# Patient Record
Sex: Female | Born: 1955
Health system: Southern US, Community
[De-identification: ages and names within clinical notes are randomized; demographics above are authoritative.]

---

## 2017-10-26 DIAGNOSIS — F410 Panic disorder [episodic paroxysmal anxiety] without agoraphobia: Secondary | ICD-10-CM | POA: Diagnosis not present

## 2018-04-19 DIAGNOSIS — F410 Panic disorder [episodic paroxysmal anxiety] without agoraphobia: Secondary | ICD-10-CM | POA: Diagnosis not present

## 2018-04-19 DIAGNOSIS — F4310 Post-traumatic stress disorder, unspecified: Secondary | ICD-10-CM | POA: Diagnosis not present

## 2018-09-28 DIAGNOSIS — Z20828 Contact with and (suspected) exposure to other viral communicable diseases: Secondary | ICD-10-CM | POA: Diagnosis not present

## 2018-10-18 DIAGNOSIS — F4010 Social phobia, unspecified: Secondary | ICD-10-CM | POA: Diagnosis not present

## 2018-10-18 DIAGNOSIS — F4310 Post-traumatic stress disorder, unspecified: Secondary | ICD-10-CM | POA: Diagnosis not present

## 2019-02-14 DIAGNOSIS — D1801 Hemangioma of skin and subcutaneous tissue: Secondary | ICD-10-CM | POA: Diagnosis not present

## 2019-02-14 DIAGNOSIS — D2339 Other benign neoplasm of skin of other parts of face: Secondary | ICD-10-CM | POA: Diagnosis not present

## 2019-02-14 DIAGNOSIS — Z85828 Personal history of other malignant neoplasm of skin: Secondary | ICD-10-CM | POA: Diagnosis not present

## 2019-02-14 DIAGNOSIS — L820 Inflamed seborrheic keratosis: Secondary | ICD-10-CM | POA: Diagnosis not present

## 2019-02-14 DIAGNOSIS — D485 Neoplasm of uncertain behavior of skin: Secondary | ICD-10-CM | POA: Diagnosis not present

## 2019-02-14 DIAGNOSIS — L570 Actinic keratosis: Secondary | ICD-10-CM | POA: Diagnosis not present

## 2019-02-14 DIAGNOSIS — D225 Melanocytic nevi of trunk: Secondary | ICD-10-CM | POA: Diagnosis not present

## 2019-02-14 DIAGNOSIS — C44719 Basal cell carcinoma of skin of left lower limb, including hip: Secondary | ICD-10-CM | POA: Diagnosis not present

## 2019-03-05 ENCOUNTER — Encounter (HOSPITAL_COMMUNITY): Payer: Self-pay | Admitting: Emergency Medicine

## 2019-03-05 ENCOUNTER — Other Ambulatory Visit: Payer: Self-pay

## 2019-03-05 ENCOUNTER — Ambulatory Visit (HOSPITAL_COMMUNITY)
Admission: EM | Admit: 2019-03-05 | Discharge: 2019-03-05 | Disposition: A | Discharge status: Home / Self Care | Payer: BLUE CROSS/BLUE SHIELD | Attending: Family Medicine | Admitting: Family Medicine

## 2019-03-05 DIAGNOSIS — F418 Other specified anxiety disorders: Secondary | ICD-10-CM | POA: Diagnosis not present

## 2019-03-05 DIAGNOSIS — I10 Essential (primary) hypertension: Secondary | ICD-10-CM | POA: Diagnosis not present

## 2019-03-05 MED ORDER — DILTIAZEM HCL ER 60 MG PO CP12: 60.0000 mg | ORAL_CAPSULE | Freq: Two times a day (BID) | ORAL | 0 refills | Status: AC

## 2019-03-05 NOTE — Discharge Instructions (Signed)
Consider [facility] for a internal medicine or family medicine doctor Take the diltiazem 2 x a day Call or return for problems

## 2019-03-05 NOTE — ED Provider Notes (Signed)
[facility]    CSN: [id number] Arrival date & time: 03/05/19  1344      History   Chief Complaint Chief Complaint  Patient presents with  . Hypertension    HPI Anna Joseph is a 64 y.o. female.   HPI  Patient states that last time she went to her doctor her blood pressure was "borderline".  She has been feeling like she might have high blood pressure for some time.  She states that she has been feeling more tired.  She feels irritable.  She has no headaches.  She even wakes up in the middle the night with headaches sometimes.  She states that this morning she had a nosebleed, for no reason.  She states that she was at her sister's house who took her blood pressure.  Blood pressure was 169/117.  After resting it got down to 159/104.  She is here for evaluation.  She would like blood pressure medication. She also has anxiety and depression.  She takes Xanax twice a day, but not every day.  She states that her job is very stressful for her.  She has an appointment with her psychiatrist in February.  I discussed with her whether she wanted to start an SSRI pending her doctor appointment, she does seem quite anxious at the visit and relates that she has been very irritable.  She states that she will wait until she sees her psychiatrist. I reminded her that with hypertension she will need follow-up.  She needs a work-up.  She needs to see a primary care doctor. She is trying to quit smoking.  She has cut her cigarettes down from a pack a day to half pack a day.  She thinks this is increasing her anxiety.  She states she has been gaining weight.  She thinks his blood pressure is elevated.  We talked about maybe starting Wellbutrin to help her with her smoking and with her anxiety.  She refuses stating that Wellbutrin caused her to have rapid heartbeats and and cough medicine side effects in the past.  Cough History reviewed. No pertinent past medical history.  There are no problems to  display for this patient.   History reviewed. No pertinent surgical history.  OB History   No obstetric history on file.      Home Medications    Prior to Admission medications   Medication Sig Start Date End Date Taking? Authorizing Provider  ALPRAZolam Duanne Moron) 0.5 MG tablet Take 0.5 mg by mouth at bedtime as needed for anxiety.   Yes [provider]  diltiazem (CARDIZEM SR) 60 MG 12 hr capsule Take 1 capsule (60 mg total) by mouth 2 (two) times daily. 03/05/19   Raylene Everts, MD    Family History Family History  Problem Relation Age of Onset  . Dementia Mother     Social History Social History   Tobacco Use  . Smoking status: Current Every Day Smoker  Substance Use Topics  . Alcohol use: Yes  . Drug use: Never     Allergies   Patient has no known allergies.   Review of Systems Review of Systems  Constitutional: Negative for chills and fever.  HENT: Positive for nosebleeds. Negative for congestion.   Eyes: Negative for visual disturbance.  Cardiovascular: Positive for palpitations. Negative for chest pain.  Gastrointestinal: Negative for diarrhea, nausea and vomiting.  Genitourinary: Negative for dysuria and frequency.  Musculoskeletal: Negative for myalgias.  Neurological: Positive for headaches. Negative for dizziness  and seizures.  Psychiatric/Behavioral: The patient is not nervous/anxious.      Physical Exam Triage Vital Signs ED Triage Vitals  Enc Vitals Group     BP 03/05/19 1425 (!) 164/109     Pulse Rate 03/05/19 1425 73     Resp 03/05/19 1425 (!) 22     Temp 03/05/19 1425 98.6 F (37 C)     Temp Source 03/05/19 1425 Oral     SpO2 03/05/19 1425 97 %     Weight --      Height --      Head Circumference --      Peak Flow --      Pain Score 03/05/19 1418 0     Pain Loc --      Pain Edu? --      Excl. in Campus? --    No data found.  Updated Vital Signs BP (!) 165/101 (BP Location: Left Arm)   Pulse 73   Temp 98.6 F (37  C) (Oral)   Resp (!) 22   SpO2 97%     Physical Exam Constitutional:      General: She is not in acute distress.    Appearance: She is well-developed.  HENT:     Head: Normocephalic and atraumatic.     Mouth/Throat:     Comments: Mask in place Eyes:     Conjunctiva/sclera: Conjunctivae normal.     Pupils: Pupils are equal, round, and reactive to light.  Cardiovascular:     Rate and Rhythm: Normal rate and regular rhythm.     Heart sounds: Normal heart sounds.  Pulmonary:     Effort: Pulmonary effort is normal. No respiratory distress.     Breath sounds: Normal breath sounds. No wheezing or rales.  Musculoskeletal:        General: Normal range of motion.     Cervical back: Normal range of motion.  Skin:    General: Skin is warm and dry.  Neurological:     General: No focal deficit present.     Mental Status: She is alert.  Psychiatric:        Behavior: Behavior normal.     Comments: Mild lability.  Moderate anxiety      UC Treatments / Results  Labs (all labs ordered are listed, but only abnormal results are displayed) Labs Reviewed - No data to display  EKG   Radiology No results found.  Procedures Procedures (including critical care time)  Medications Ordered in UC Medications - No data to display  Initial Impression / Assessment and Plan / UC Course  I have reviewed the triage vital signs and the nursing notes.  Pertinent labs & imaging results that were available during my care of the patient were reviewed by me and considered in my medical decision making (see chart for details).     Going to place the patient on diltiazem.  I think this will help control some of the rapid heart rate and bring her blood pressure down.  The calcium channel blockers classically have very few side effects. Final Clinical Impressions(s) / UC Diagnoses   Final diagnoses:  Essential hypertension  Anxiety with depression     Discharge Instructions     Consider  [facility] for a internal medicine or family medicine doctor Take the diltiazem 2 x a day Call or return for problems   ED Prescriptions    Medication Sig Dispense Auth. Provider   diltiazem (CARDIZEM SR) 60 MG 12  hr capsule Take 1 capsule (60 mg total) by mouth 2 (two) times daily. 60 capsule Raylene Everts, MD     PDMP not reviewed this encounter.   Raylene Everts, MD 03/05/19 [phone]

## 2019-03-05 NOTE — ED Triage Notes (Signed)
This morning noticed nose was bleeding for approx 1/2 hour.  169/117 was blood pressure reading at that time .  Last bp was 159/104 and nose bleed had stopped.  Patient reports having slight headaches, intermittent for 2 weeks.  Patient has tried to quit smoking since christmas and reports having put on more weight this past year.

## 2019-03-16 ENCOUNTER — Telehealth: Payer: Self-pay | Admitting: Plastic Surgery

## 2019-03-16 NOTE — Telephone Encounter (Signed)

## 2019-03-17 ENCOUNTER — Other Ambulatory Visit: Payer: Self-pay

## 2019-03-17 ENCOUNTER — Ambulatory Visit (INDEPENDENT_AMBULATORY_CARE_PROVIDER_SITE_OTHER): Payer: BC Managed Care – PPO | Admitting: Plastic Surgery

## 2019-03-17 ENCOUNTER — Encounter: Payer: Self-pay | Admitting: Plastic Surgery

## 2019-03-17 DIAGNOSIS — D171 Benign lipomatous neoplasm of skin and subcutaneous tissue of trunk: Secondary | ICD-10-CM | POA: Diagnosis not present

## 2019-03-17 DIAGNOSIS — D170 Benign lipomatous neoplasm of skin and subcutaneous tissue of head, face and neck: Secondary | ICD-10-CM

## 2019-03-17 DIAGNOSIS — M5410 Radiculopathy, site unspecified: Secondary | ICD-10-CM | POA: Insufficient documentation

## 2019-03-17 DIAGNOSIS — M5412 Radiculopathy, cervical region: Secondary | ICD-10-CM

## 2019-03-17 NOTE — Progress Notes (Signed)
Patient ID: Anna Joseph, female    DOB: Jun 16, 1955, 64 y.o.   MRN: [id number]   Chief Complaint  Patient presents with  . Advice Only    for neoplasm for uncertain behavior of skin    The patient is a 64 year old female here for evaluation of a mass on her neck /left back area.  It is approximately 3 -4 cm in size.  It is a little bit firm but unable to get my fingers around it.  It slightly movable.  Its not tender to touch.  The patient states it has been there for over a year and seems to be getting bigger.  She is now concerned because she thinks it may be related to other symptoms.  She is starting to get numbness and tingling going all the way down her arm.  It seems to be located at the thenar eminence where she feels it the most on the radial palmar aspect of her hand.  I am not able to reproduce the symptoms.  She says it is worse when she sitting and at work.  She works at a bank.  Her pulses are all equal bilaterally.  Concerned about her neck.  She is not had a work-up.  Her mammogram is out of date.  She has had one and it was several years ago.  She is 5 feet 6 inches tall and weighs 182 pounds.   Review of Systems  Constitutional: Negative.  Negative for activity change and appetite change.  HENT: Negative.   Eyes: Negative.   Respiratory: Negative.  Negative for chest tightness.   Cardiovascular: Negative.  Negative for leg swelling.  Gastrointestinal: Negative for abdominal distention and abdominal pain.  Endocrine: Negative.   Genitourinary: Negative.   Musculoskeletal: Positive for back pain.  Hematological: Negative.     No past medical history on file.  No past surgical history on file.    Current Outpatient Medications:  .  ALPRAZolam (XANAX) 0.5 MG tablet, Take 0.5 mg by mouth at bedtime as needed for anxiety., Disp: , Rfl:  .  diltiazem (CARDIZEM SR) 60 MG 12 hr capsule, Take 1 capsule (60 mg total) by mouth 2 (two) times daily., Disp: 60 capsule, Rfl: 0     Objective:   Vitals:   03/17/19 1348  BP: 120/81  Pulse: 80  Temp: 97.7 F (36.5 C)  SpO2: 95%    Physical Exam Vitals and nursing note reviewed.  Constitutional:      Appearance: Normal appearance.  HENT:     Head: Normocephalic.  Cardiovascular:     Rate and Rhythm: Normal rate.     Pulses: Normal pulses.  Pulmonary:     Effort: Pulmonary effort is normal. No respiratory distress.  Abdominal:     General: Abdomen is flat. There is no distension.  Musculoskeletal:       Arms:  Skin:    Capillary Refill: Capillary refill takes less than 2 seconds.  Neurological:     General: No focal deficit present.     Mental Status: She is alert and oriented to person, place, and time.  Psychiatric:        Mood and Affect: Mood normal.        Thought Content: Thought content normal.     Assessment & Plan:  Lipoma of back  Lipoma of neck  Cervical radiculopathy  Recommend updating the mammogram.  I will go ahead and put the order in for this the patient is  agreeable.  I like to get an ultrasound of the lipoma to be sure the muscles not involved.  I also spoke with neurosurgery and feel she would benefit from a consult with them regarding this radiculopathy.  Pictures were obtained of the patient and placed in the chart with the patient's or guardian's permission.   Wallace Going, DO    The 21st Century Cures Act was signed into law in 2016 which includes the topic of electronic health records.  This provides immediate access to information in MyChart.  This includes consultation notes, operative notes, office notes, lab results and pathology reports.  If you have any questions about what you read please let us know at your next visit or call us at the office.  We are right here with you.

## 2019-03-21 DIAGNOSIS — C44719 Basal cell carcinoma of skin of left lower limb, including hip: Secondary | ICD-10-CM | POA: Diagnosis not present

## 2019-03-31 DIAGNOSIS — Z13220 Encounter for screening for lipoid disorders: Secondary | ICD-10-CM | POA: Diagnosis not present

## 2019-03-31 DIAGNOSIS — Z7289 Other problems related to lifestyle: Secondary | ICD-10-CM | POA: Diagnosis not present

## 2019-03-31 DIAGNOSIS — Z720 Tobacco use: Secondary | ICD-10-CM | POA: Diagnosis not present

## 2019-03-31 DIAGNOSIS — I10 Essential (primary) hypertension: Secondary | ICD-10-CM | POA: Diagnosis not present

## 2019-04-11 ENCOUNTER — Ambulatory Visit (HOSPITAL_COMMUNITY): Payer: BC Managed Care – PPO

## 2019-04-13 DIAGNOSIS — F4310 Post-traumatic stress disorder, unspecified: Secondary | ICD-10-CM | POA: Diagnosis not present

## 2019-04-13 DIAGNOSIS — F4010 Social phobia, unspecified: Secondary | ICD-10-CM | POA: Diagnosis not present

## 2019-04-13 DIAGNOSIS — F410 Panic disorder [episodic paroxysmal anxiety] without agoraphobia: Secondary | ICD-10-CM | POA: Diagnosis not present

## 2019-05-10 ENCOUNTER — Other Ambulatory Visit: Payer: Self-pay

## 2019-05-10 ENCOUNTER — Ambulatory Visit (HOSPITAL_COMMUNITY)
Admission: RE | Admit: 2019-05-10 | Discharge: 2019-05-10 | Disposition: A | Discharge status: Home / Self Care | Payer: BC Managed Care – PPO | Source: Ambulatory Visit | Attending: Plastic Surgery | Admitting: Plastic Surgery

## 2019-05-10 ENCOUNTER — Ambulatory Visit
Admission: RE | Admit: 2019-05-10 | Discharge: 2019-05-10 | Disposition: A | Discharge status: Home / Self Care | Payer: BC Managed Care – PPO | Source: Ambulatory Visit | Attending: Plastic Surgery | Admitting: Plastic Surgery

## 2019-05-10 DIAGNOSIS — M5412 Radiculopathy, cervical region: Secondary | ICD-10-CM | POA: Insufficient documentation

## 2019-05-10 DIAGNOSIS — D170 Benign lipomatous neoplasm of skin and subcutaneous tissue of head, face and neck: Secondary | ICD-10-CM | POA: Insufficient documentation

## 2019-05-10 DIAGNOSIS — Z1231 Encounter for screening mammogram for malignant neoplasm of breast: Secondary | ICD-10-CM | POA: Diagnosis not present

## 2019-05-10 DIAGNOSIS — D171 Benign lipomatous neoplasm of skin and subcutaneous tissue of trunk: Secondary | ICD-10-CM | POA: Insufficient documentation

## 2019-05-10 DIAGNOSIS — R221 Localized swelling, mass and lump, neck: Secondary | ICD-10-CM | POA: Diagnosis not present

## 2019-05-11 ENCOUNTER — Other Ambulatory Visit: Payer: Self-pay | Admitting: Plastic Surgery

## 2019-05-11 ENCOUNTER — Telehealth: Payer: Self-pay | Admitting: Plastic Surgery

## 2019-05-11 DIAGNOSIS — R928 Other abnormal and inconclusive findings on diagnostic imaging of breast: Secondary | ICD-10-CM

## 2019-05-15 ENCOUNTER — Ambulatory Visit
Admission: RE | Admit: 2019-05-15 | Discharge: 2019-05-15 | Disposition: A | Discharge status: Home / Self Care | Payer: BC Managed Care – PPO | Source: Ambulatory Visit | Attending: Plastic Surgery | Admitting: Plastic Surgery

## 2019-05-15 ENCOUNTER — Other Ambulatory Visit: Payer: Self-pay

## 2019-05-15 DIAGNOSIS — R928 Other abnormal and inconclusive findings on diagnostic imaging of breast: Secondary | ICD-10-CM

## 2019-05-15 DIAGNOSIS — N6001 Solitary cyst of right breast: Secondary | ICD-10-CM | POA: Diagnosis not present

## 2019-06-06 DIAGNOSIS — Z6829 Body mass index (BMI) 29.0-29.9, adult: Secondary | ICD-10-CM | POA: Diagnosis not present

## 2019-06-06 DIAGNOSIS — I10 Essential (primary) hypertension: Secondary | ICD-10-CM | POA: Diagnosis not present

## 2019-06-06 DIAGNOSIS — M5412 Radiculopathy, cervical region: Secondary | ICD-10-CM | POA: Diagnosis not present

## 2019-06-13 DIAGNOSIS — M5412 Radiculopathy, cervical region: Secondary | ICD-10-CM | POA: Diagnosis not present

## 2019-06-13 DIAGNOSIS — M542 Cervicalgia: Secondary | ICD-10-CM | POA: Diagnosis not present

## 2019-06-15 DIAGNOSIS — Z6829 Body mass index (BMI) 29.0-29.9, adult: Secondary | ICD-10-CM | POA: Diagnosis not present

## 2019-06-15 DIAGNOSIS — M5412 Radiculopathy, cervical region: Secondary | ICD-10-CM | POA: Diagnosis not present

## 2019-06-15 DIAGNOSIS — I10 Essential (primary) hypertension: Secondary | ICD-10-CM | POA: Diagnosis not present

## 2019-06-19 DIAGNOSIS — L304 Erythema intertrigo: Secondary | ICD-10-CM | POA: Diagnosis not present

## 2019-06-19 DIAGNOSIS — L570 Actinic keratosis: Secondary | ICD-10-CM | POA: Diagnosis not present

## 2019-06-19 DIAGNOSIS — L905 Scar conditions and fibrosis of skin: Secondary | ICD-10-CM | POA: Diagnosis not present

## 2019-06-19 DIAGNOSIS — L821 Other seborrheic keratosis: Secondary | ICD-10-CM | POA: Diagnosis not present

## 2019-06-19 DIAGNOSIS — D225 Melanocytic nevi of trunk: Secondary | ICD-10-CM | POA: Diagnosis not present

## 2019-06-28 ENCOUNTER — Other Ambulatory Visit: Payer: Self-pay | Admitting: Family Medicine

## 2019-06-28 ENCOUNTER — Other Ambulatory Visit (HOSPITAL_COMMUNITY)
Admission: RE | Admit: 2019-06-28 | Discharge: 2019-06-28 | Disposition: A | Discharge status: Home / Self Care | Payer: BC Managed Care – PPO | Source: Ambulatory Visit | Attending: Family Medicine | Admitting: Family Medicine

## 2019-06-28 DIAGNOSIS — Z01411 Encounter for gynecological examination (general) (routine) with abnormal findings: Secondary | ICD-10-CM | POA: Insufficient documentation

## 2019-06-28 DIAGNOSIS — E2839 Other primary ovarian failure: Secondary | ICD-10-CM

## 2019-06-28 DIAGNOSIS — Z0000 Encounter for general adult medical examination without abnormal findings: Secondary | ICD-10-CM | POA: Diagnosis not present

## 2019-06-28 DIAGNOSIS — I10 Essential (primary) hypertension: Secondary | ICD-10-CM | POA: Diagnosis not present

## 2019-07-03 LAB — CYTOLOGY - PAP
Comment: NEGATIVE
Diagnosis: NEGATIVE
High risk HPV: NEGATIVE

## 2019-07-12 DIAGNOSIS — I10 Essential (primary) hypertension: Secondary | ICD-10-CM | POA: Diagnosis not present

## 2019-09-06 ENCOUNTER — Other Ambulatory Visit: Payer: BC Managed Care – PPO

## 2019-10-04 DIAGNOSIS — F4310 Post-traumatic stress disorder, unspecified: Secondary | ICD-10-CM | POA: Diagnosis not present

## 2019-10-04 DIAGNOSIS — F410 Panic disorder [episodic paroxysmal anxiety] without agoraphobia: Secondary | ICD-10-CM | POA: Diagnosis not present

## 2019-10-04 DIAGNOSIS — F4010 Social phobia, unspecified: Secondary | ICD-10-CM | POA: Diagnosis not present

## 2019-10-04 DIAGNOSIS — F4321 Adjustment disorder with depressed mood: Secondary | ICD-10-CM | POA: Diagnosis not present

## 2019-11-06 ENCOUNTER — Other Ambulatory Visit: Payer: Self-pay

## 2019-11-06 ENCOUNTER — Ambulatory Visit
Admission: RE | Admit: 2019-11-06 | Discharge: 2019-11-06 | Disposition: A | Discharge status: Home / Self Care | Payer: BC Managed Care – PPO | Source: Ambulatory Visit | Attending: Family Medicine | Admitting: Family Medicine

## 2019-11-06 DIAGNOSIS — M8589 Other specified disorders of bone density and structure, multiple sites: Secondary | ICD-10-CM | POA: Diagnosis not present

## 2019-11-06 DIAGNOSIS — E2839 Other primary ovarian failure: Secondary | ICD-10-CM

## 2019-11-06 DIAGNOSIS — Z780 Asymptomatic menopausal state: Secondary | ICD-10-CM | POA: Diagnosis not present

## 2019-11-30 DIAGNOSIS — F410 Panic disorder [episodic paroxysmal anxiety] without agoraphobia: Secondary | ICD-10-CM | POA: Diagnosis not present

## 2019-11-30 DIAGNOSIS — F3342 Major depressive disorder, recurrent, in full remission: Secondary | ICD-10-CM | POA: Diagnosis not present

## 2019-12-29 DIAGNOSIS — I10 Essential (primary) hypertension: Secondary | ICD-10-CM | POA: Diagnosis not present

## 2019-12-29 DIAGNOSIS — E785 Hyperlipidemia, unspecified: Secondary | ICD-10-CM | POA: Diagnosis not present

## 2020-01-26 DIAGNOSIS — L989 Disorder of the skin and subcutaneous tissue, unspecified: Secondary | ICD-10-CM | POA: Diagnosis not present

## 2020-01-26 DIAGNOSIS — L819 Disorder of pigmentation, unspecified: Secondary | ICD-10-CM | POA: Diagnosis not present

## 2020-01-26 DIAGNOSIS — C4441 Basal cell carcinoma of skin of scalp and neck: Secondary | ICD-10-CM | POA: Diagnosis not present

## 2020-01-26 DIAGNOSIS — L820 Inflamed seborrheic keratosis: Secondary | ICD-10-CM | POA: Diagnosis not present

## 2020-01-26 DIAGNOSIS — L570 Actinic keratosis: Secondary | ICD-10-CM | POA: Diagnosis not present

## 2020-01-26 DIAGNOSIS — D485 Neoplasm of uncertain behavior of skin: Secondary | ICD-10-CM | POA: Diagnosis not present

## 2020-02-05 DIAGNOSIS — C4401 Basal cell carcinoma of skin of lip: Secondary | ICD-10-CM | POA: Diagnosis not present

## 2020-02-05 DIAGNOSIS — C44319 Basal cell carcinoma of skin of other parts of face: Secondary | ICD-10-CM | POA: Diagnosis not present

## 2020-02-05 DIAGNOSIS — C4441 Basal cell carcinoma of skin of scalp and neck: Secondary | ICD-10-CM | POA: Diagnosis not present

## 2020-02-08 DIAGNOSIS — M25561 Pain in right knee: Secondary | ICD-10-CM | POA: Diagnosis not present

## 2020-02-19 DIAGNOSIS — C4441 Basal cell carcinoma of skin of scalp and neck: Secondary | ICD-10-CM | POA: Diagnosis not present

## 2020-03-07 DIAGNOSIS — S83242A Other tear of medial meniscus, current injury, left knee, initial encounter: Secondary | ICD-10-CM | POA: Diagnosis not present

## 2020-03-11 DIAGNOSIS — M25562 Pain in left knee: Secondary | ICD-10-CM | POA: Diagnosis not present

## 2020-03-13 DIAGNOSIS — S83242A Other tear of medial meniscus, current injury, left knee, initial encounter: Secondary | ICD-10-CM | POA: Diagnosis not present

## 2020-03-27 DIAGNOSIS — S83242D Other tear of medial meniscus, current injury, left knee, subsequent encounter: Secondary | ICD-10-CM | POA: Diagnosis not present

## 2020-04-04 NOTE — Telephone Encounter (Signed)
sign

## 2020-04-11 DIAGNOSIS — M94262 Chondromalacia, left knee: Secondary | ICD-10-CM | POA: Diagnosis not present

## 2020-04-11 DIAGNOSIS — Y999 Unspecified external cause status: Secondary | ICD-10-CM | POA: Diagnosis not present

## 2020-04-11 DIAGNOSIS — S83242A Other tear of medial meniscus, current injury, left knee, initial encounter: Secondary | ICD-10-CM | POA: Diagnosis not present

## 2020-04-11 DIAGNOSIS — S83232A Complex tear of medial meniscus, current injury, left knee, initial encounter: Secondary | ICD-10-CM | POA: Diagnosis not present

## 2020-04-11 DIAGNOSIS — M958 Other specified acquired deformities of musculoskeletal system: Secondary | ICD-10-CM | POA: Diagnosis not present

## 2020-04-11 DIAGNOSIS — X58XXXA Exposure to other specified factors, initial encounter: Secondary | ICD-10-CM | POA: Diagnosis not present

## 2020-04-25 DIAGNOSIS — L905 Scar conditions and fibrosis of skin: Secondary | ICD-10-CM | POA: Diagnosis not present

## 2020-04-25 DIAGNOSIS — L820 Inflamed seborrheic keratosis: Secondary | ICD-10-CM | POA: Diagnosis not present

## 2020-04-25 DIAGNOSIS — D225 Melanocytic nevi of trunk: Secondary | ICD-10-CM | POA: Diagnosis not present

## 2020-04-25 DIAGNOSIS — D1801 Hemangioma of skin and subcutaneous tissue: Secondary | ICD-10-CM | POA: Diagnosis not present

## 2020-04-25 DIAGNOSIS — L570 Actinic keratosis: Secondary | ICD-10-CM | POA: Diagnosis not present

## 2020-04-25 DIAGNOSIS — Z85828 Personal history of other malignant neoplasm of skin: Secondary | ICD-10-CM | POA: Diagnosis not present

## 2020-05-15 DIAGNOSIS — G4700 Insomnia, unspecified: Secondary | ICD-10-CM | POA: Diagnosis not present

## 2020-05-15 DIAGNOSIS — F4310 Post-traumatic stress disorder, unspecified: Secondary | ICD-10-CM | POA: Diagnosis not present

## 2020-05-15 DIAGNOSIS — F410 Panic disorder [episodic paroxysmal anxiety] without agoraphobia: Secondary | ICD-10-CM | POA: Diagnosis not present

## 2020-05-29 DIAGNOSIS — S83242D Other tear of medial meniscus, current injury, left knee, subsequent encounter: Secondary | ICD-10-CM | POA: Diagnosis not present

## 2020-07-02 DIAGNOSIS — Z0000 Encounter for general adult medical examination without abnormal findings: Secondary | ICD-10-CM | POA: Diagnosis not present

## 2020-07-02 DIAGNOSIS — I10 Essential (primary) hypertension: Secondary | ICD-10-CM | POA: Diagnosis not present

## 2020-07-02 DIAGNOSIS — E785 Hyperlipidemia, unspecified: Secondary | ICD-10-CM | POA: Diagnosis not present

## 2020-07-02 DIAGNOSIS — Z7289 Other problems related to lifestyle: Secondary | ICD-10-CM | POA: Diagnosis not present

## 2020-07-05 ENCOUNTER — Other Ambulatory Visit (HOSPITAL_COMMUNITY): Payer: Self-pay | Admitting: Family Medicine

## 2020-07-10 ENCOUNTER — Other Ambulatory Visit: Payer: Self-pay

## 2020-07-10 ENCOUNTER — Ambulatory Visit (HOSPITAL_COMMUNITY)
Admission: RE | Admit: 2020-07-10 | Discharge: 2020-07-10 | Disposition: A | Discharge status: Home / Self Care | Payer: BC Managed Care – PPO | Source: Ambulatory Visit | Attending: Family Medicine | Admitting: Family Medicine

## 2020-07-10 DIAGNOSIS — E785 Hyperlipidemia, unspecified: Secondary | ICD-10-CM | POA: Insufficient documentation

## 2020-07-10 DIAGNOSIS — Z1211 Encounter for screening for malignant neoplasm of colon: Secondary | ICD-10-CM | POA: Diagnosis not present

## 2020-12-30 DIAGNOSIS — I10 Essential (primary) hypertension: Secondary | ICD-10-CM | POA: Diagnosis not present

## 2020-12-30 DIAGNOSIS — E785 Hyperlipidemia, unspecified: Secondary | ICD-10-CM | POA: Diagnosis not present

## 2020-12-30 DIAGNOSIS — L02215 Cutaneous abscess of perineum: Secondary | ICD-10-CM | POA: Diagnosis not present

## 2020-12-30 DIAGNOSIS — Z7289 Other problems related to lifestyle: Secondary | ICD-10-CM | POA: Diagnosis not present

## 2020-12-30 DIAGNOSIS — Z720 Tobacco use: Secondary | ICD-10-CM | POA: Diagnosis not present

## 2021-01-06 DIAGNOSIS — E871 Hypo-osmolality and hyponatremia: Secondary | ICD-10-CM | POA: Diagnosis not present

## 2021-07-04 DIAGNOSIS — Z720 Tobacco use: Secondary | ICD-10-CM | POA: Diagnosis not present

## 2021-07-04 DIAGNOSIS — Z7289 Other problems related to lifestyle: Secondary | ICD-10-CM | POA: Diagnosis not present

## 2021-07-04 DIAGNOSIS — Z0000 Encounter for general adult medical examination without abnormal findings: Secondary | ICD-10-CM | POA: Diagnosis not present

## 2021-07-04 DIAGNOSIS — E785 Hyperlipidemia, unspecified: Secondary | ICD-10-CM | POA: Diagnosis not present

## 2021-07-04 DIAGNOSIS — R931 Abnormal findings on diagnostic imaging of heart and coronary circulation: Secondary | ICD-10-CM | POA: Diagnosis not present

## 2021-07-04 DIAGNOSIS — I10 Essential (primary) hypertension: Secondary | ICD-10-CM | POA: Diagnosis not present

## 2021-07-04 DIAGNOSIS — Z1211 Encounter for screening for malignant neoplasm of colon: Secondary | ICD-10-CM | POA: Diagnosis not present

## 2021-07-08 DIAGNOSIS — Z1211 Encounter for screening for malignant neoplasm of colon: Secondary | ICD-10-CM | POA: Diagnosis not present

## 2021-08-22 IMAGING — MG DIGITAL SCREENING BILAT W/ CAD
4 series · 4 of 4 positions shown · non-contrast
Comparison: No priors

CLINICAL DATA: Screening.

EXAM:
DIGITAL SCREENING BILATERAL MAMMOGRAM WITH CAD

[R CC]
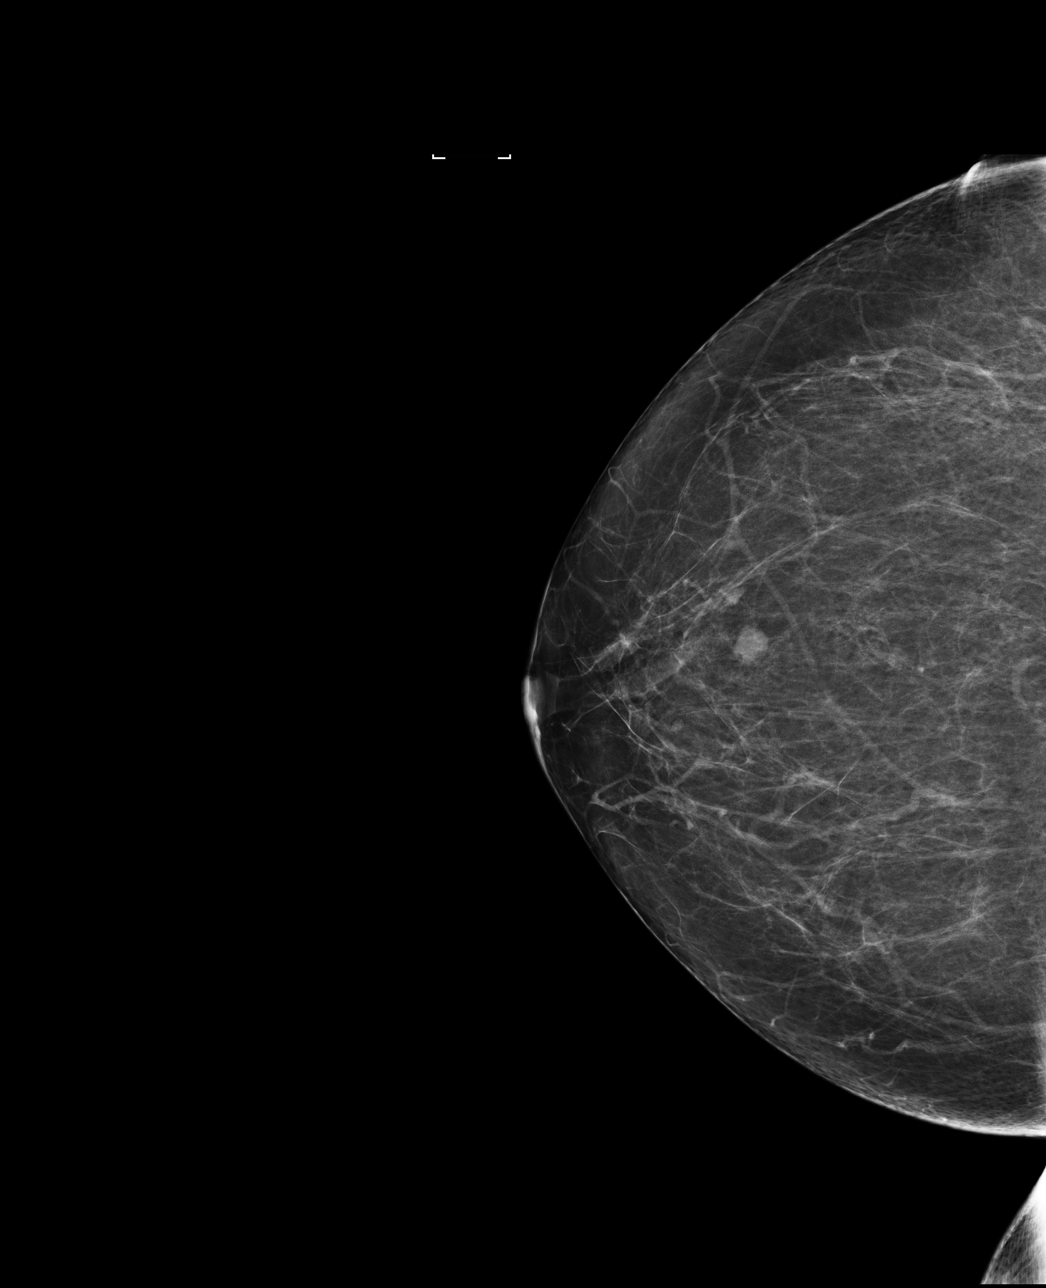

[R MLO]
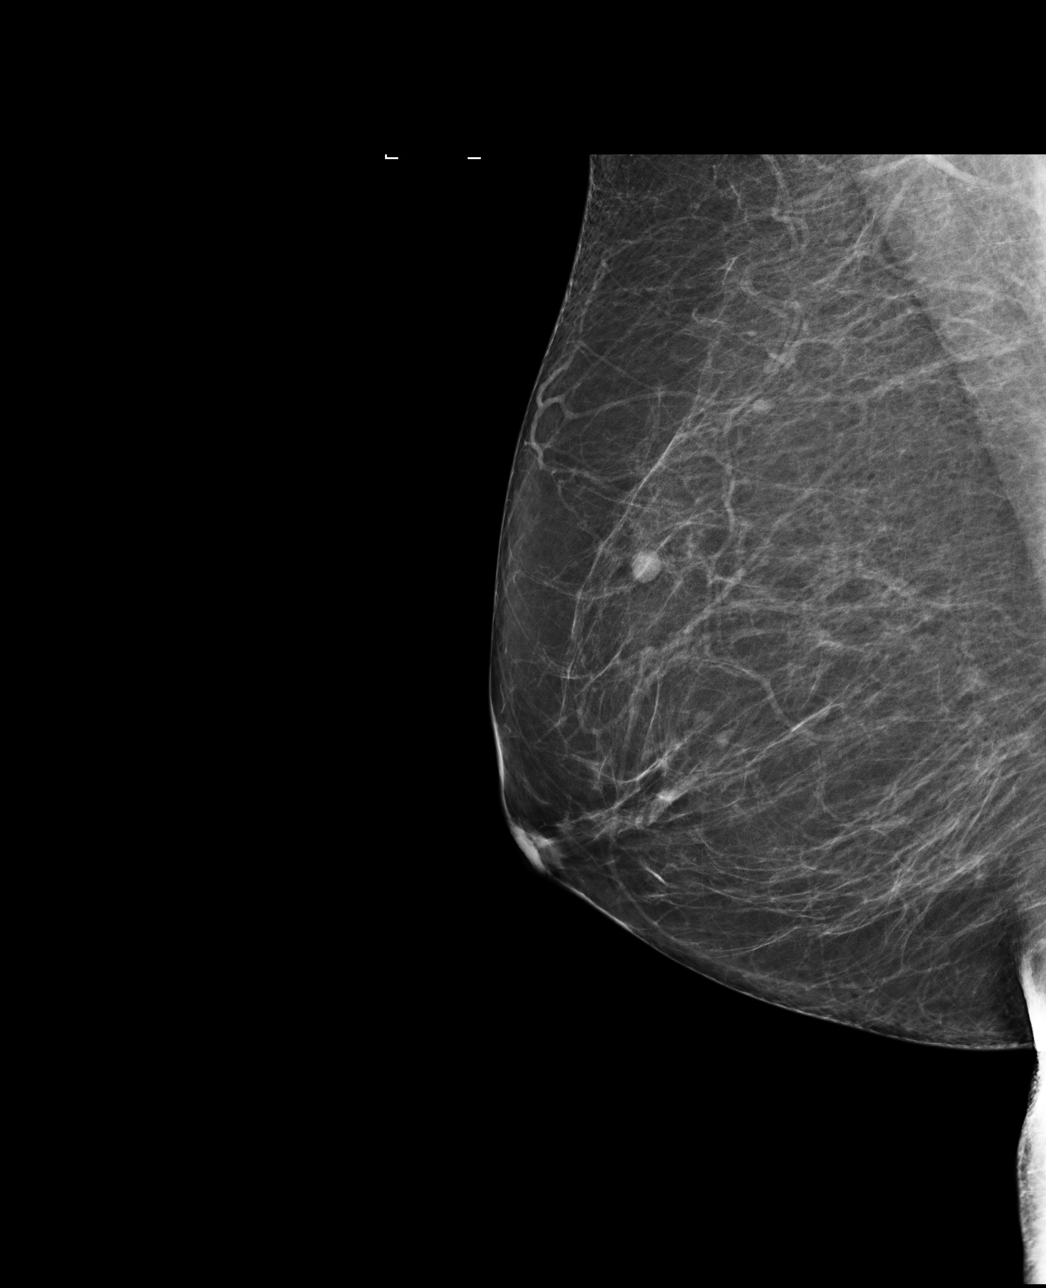

[L CC]
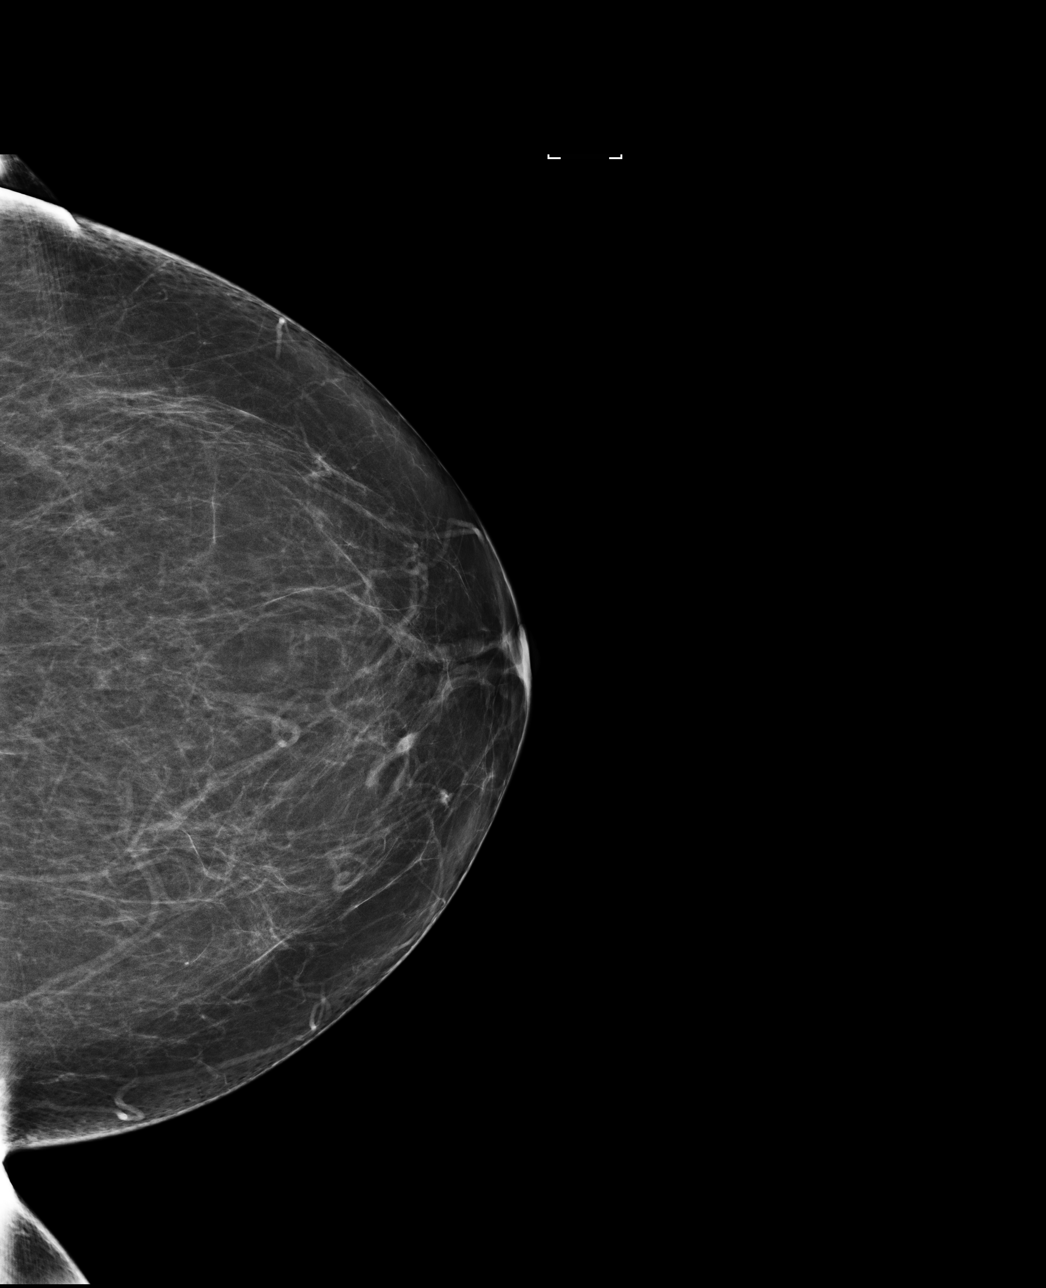

[L MLO]
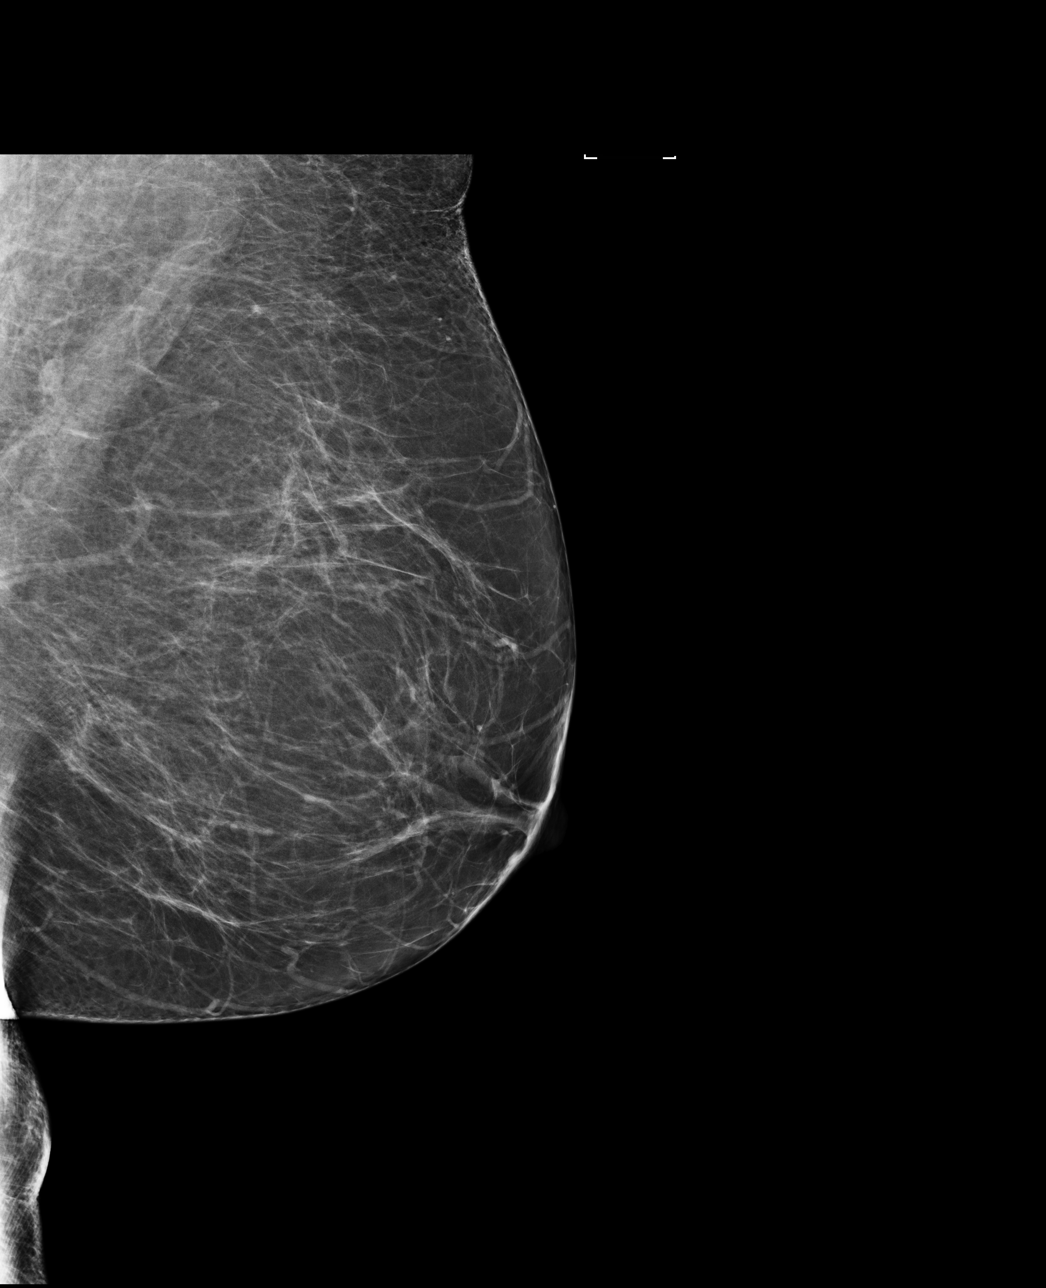

[4 of 4 positions shown; findings below may reference images not displayed]

ACR Breast Density Category b: There are scattered areas of
fibroglandular density.
FINDINGS: In the right breast, a possible mass warrants further evaluation. In
the left breast, no findings suspicious for malignancy. Images were
processed with CAD.
IMPRESSION: Further evaluation is suggested for possible mass in the right
breast.

RECOMMENDATION:
Ultrasound of the right breast. (Code:[id number]-[id number])

The patient will be contacted regarding the findings, and additional
imaging will be scheduled.

BI-RADS CATEGORY  0: Incomplete. Need additional imaging evaluation
and/or prior mammograms for comparison.

## 2021-08-25 ENCOUNTER — Other Ambulatory Visit: Payer: Self-pay | Admitting: Family Medicine

## 2021-08-25 ENCOUNTER — Ambulatory Visit
Admission: RE | Admit: 2021-08-25 | Discharge: 2021-08-25 | Disposition: A | Discharge status: Home / Self Care | Payer: Medicare HMO | Source: Ambulatory Visit | Attending: Family Medicine | Admitting: Family Medicine

## 2021-08-25 DIAGNOSIS — Z1231 Encounter for screening mammogram for malignant neoplasm of breast: Secondary | ICD-10-CM

## 2021-08-27 DIAGNOSIS — L309 Dermatitis, unspecified: Secondary | ICD-10-CM | POA: Diagnosis not present

## 2021-08-27 DIAGNOSIS — L814 Other melanin hyperpigmentation: Secondary | ICD-10-CM | POA: Diagnosis not present

## 2021-08-27 DIAGNOSIS — L728 Other follicular cysts of the skin and subcutaneous tissue: Secondary | ICD-10-CM | POA: Diagnosis not present

## 2021-08-27 DIAGNOSIS — L821 Other seborrheic keratosis: Secondary | ICD-10-CM | POA: Diagnosis not present

## 2021-08-27 DIAGNOSIS — L815 Leukoderma, not elsewhere classified: Secondary | ICD-10-CM | POA: Diagnosis not present

## 2021-08-27 IMAGING — US US BREAST*R* LIMITED INC AXILLA
1 series · 7 of 7 positions shown · non-contrast
Comparison: Baseline mammogram 05/10/2019.  No prior ultrasound.

CLINICAL DATA: Recall from baseline screening mammography, possible
mass involving the UPPER RIGHT breast at ANTERIOR to MIDDLE depth.

EXAM:
ULTRASOUND OF THE RIGHT BREAST

[Series 1: us breast*right* limited inc axilla · 0.07mm/px · 7 of 7 slices shown]
[im 1/7]
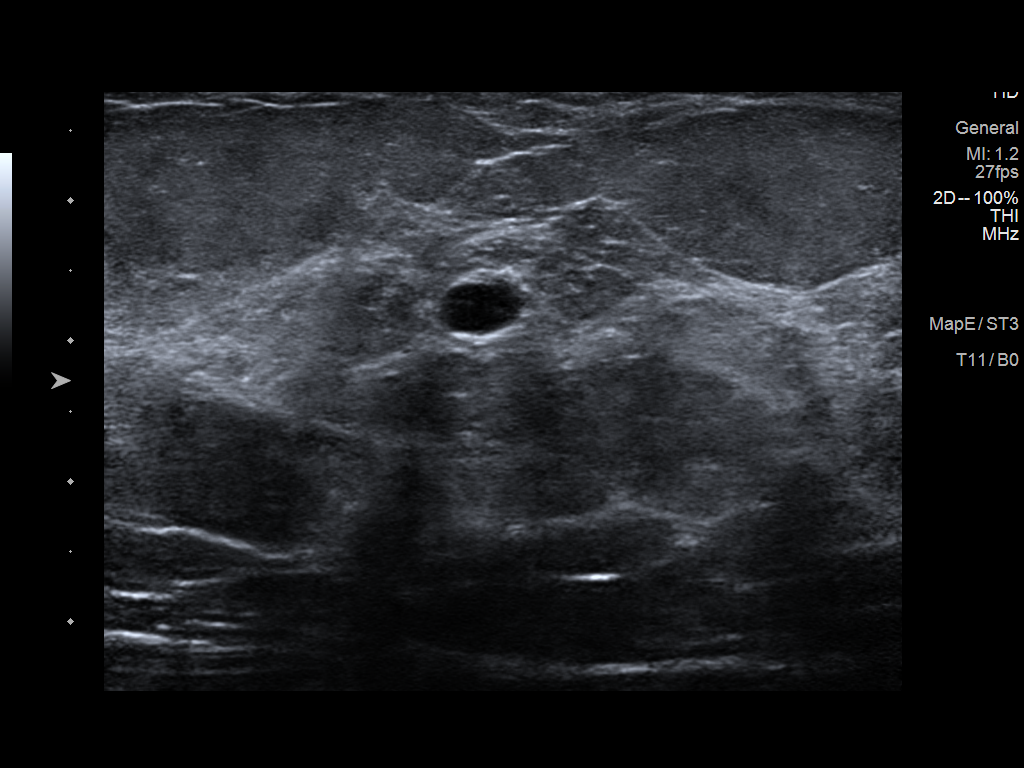
[im 2/7]
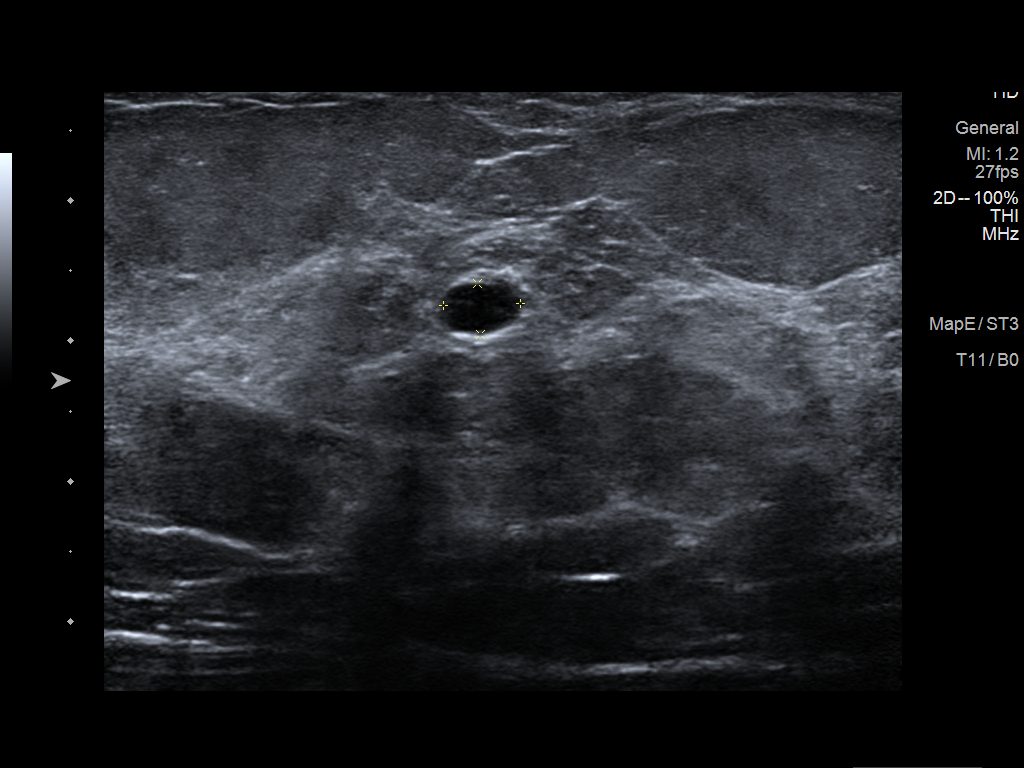
[im 3/7]
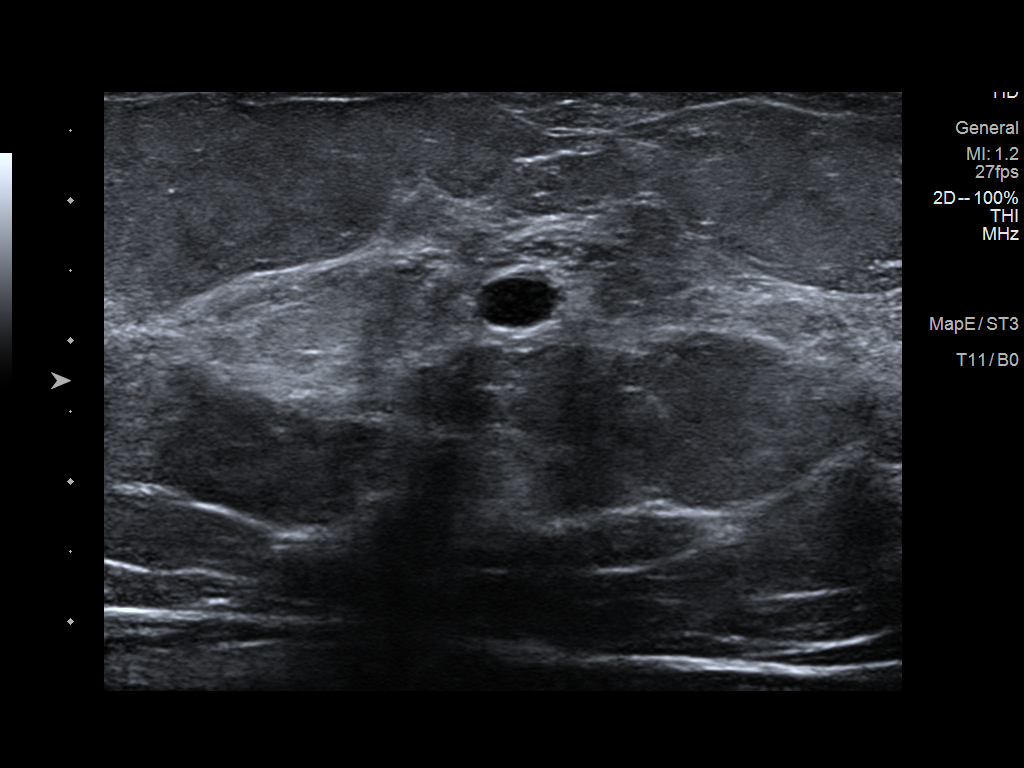
[im 4/7]
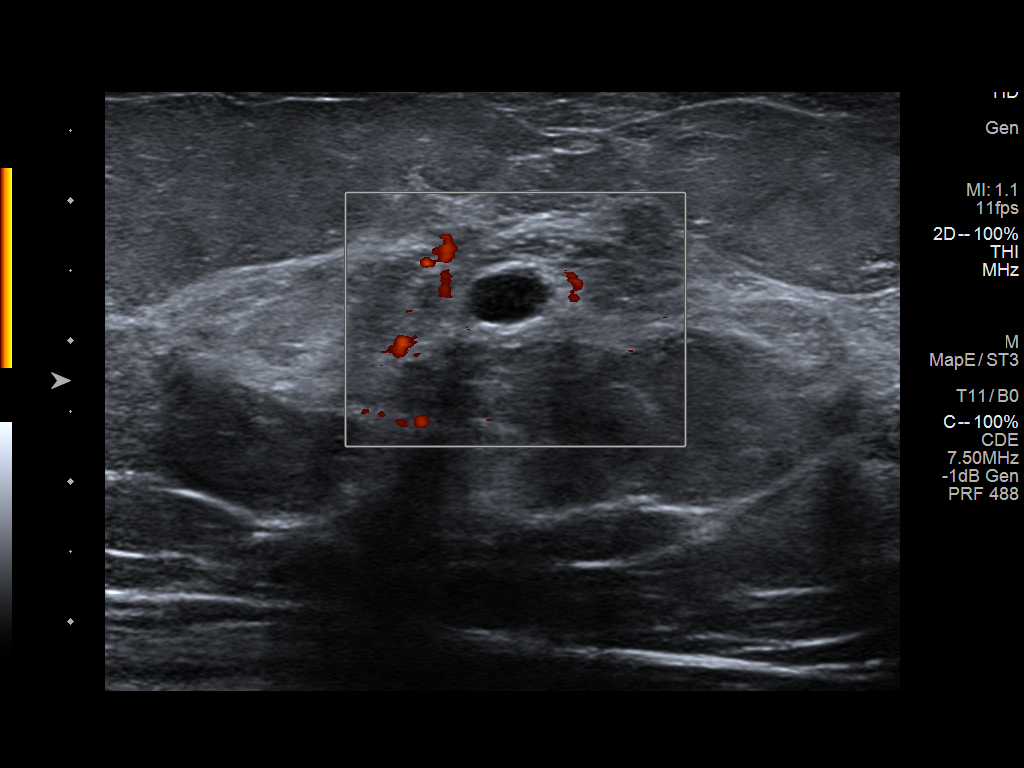
[im 5/7]
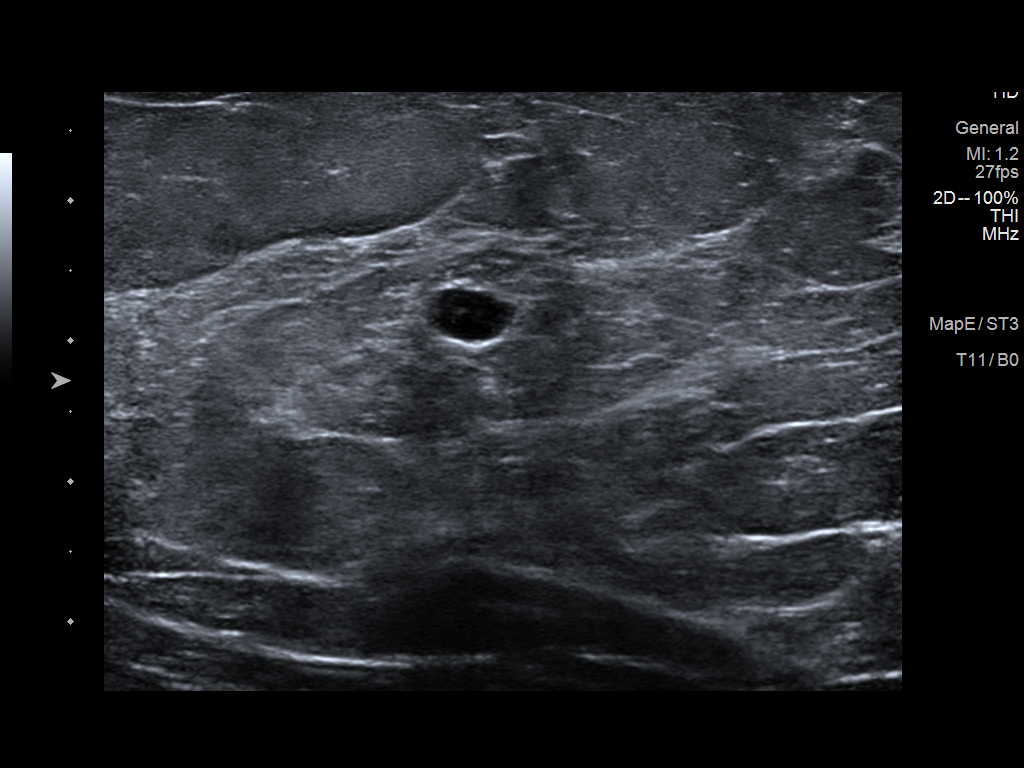
[im 6/7]
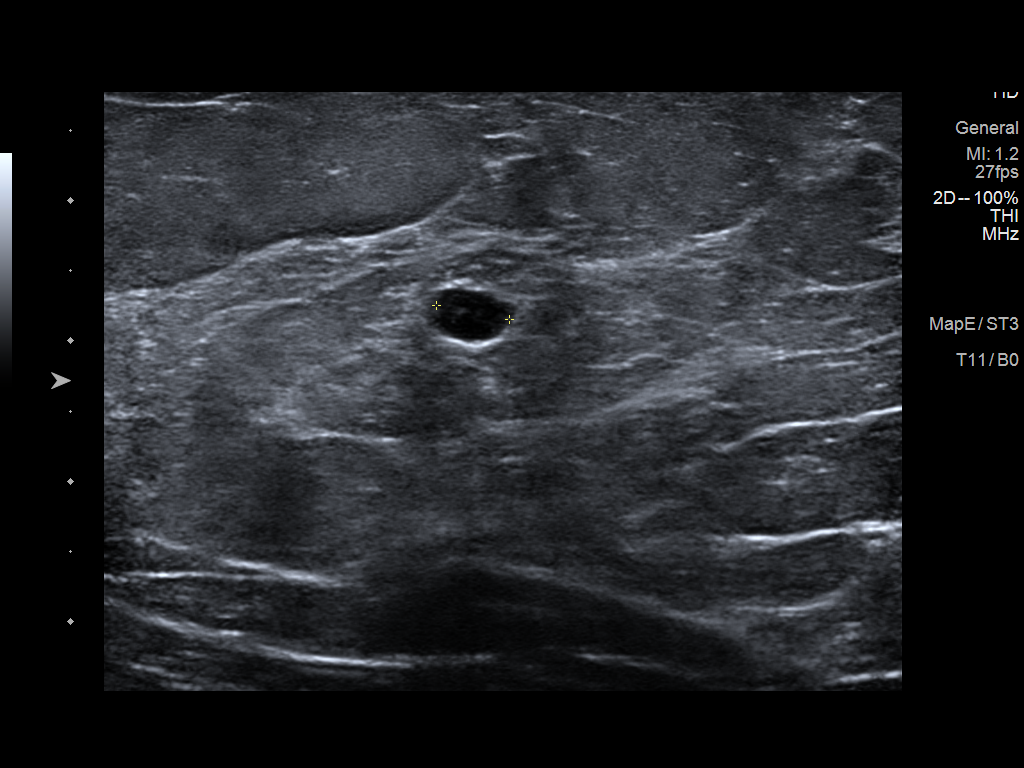
[im 7/7]
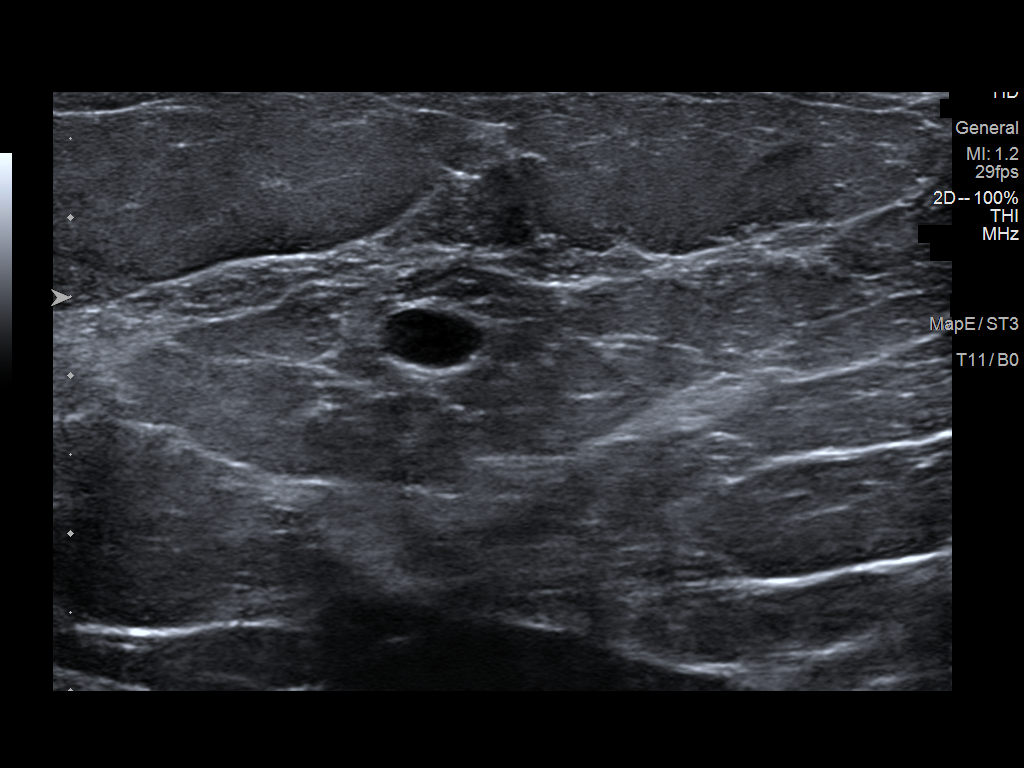

[7 of 7 positions shown; findings below may reference images not displayed]

FINDINGS: A circumscribed oval parallel nearly anechoic mass with scattered
internal echoes is identified at the 12 o'clock position of the
RIGHT breast approximately 4 cm from nipple at ANTERIOR to MIDDLE
depth, measuring approximately 4 x 6 x 5 mm, demonstrating posterior
acoustic enhancement and no internal power Doppler flow,
corresponding to the screening mammographic finding. No suspicious
solid mass or abnormal acoustic shadowing is identified.
IMPRESSION: Benign cyst in the UPPER RIGHT breast which accounts for the
baseline screening mammographic finding.

RECOMMENDATION:
Screening mammogram in one year.(Code:[id number]-[id number])

I have discussed the findings and recommendations with the patient.
If applicable, a reminder letter will be sent to the patient
regarding the next appointment.

BI-RADS CATEGORY  2: Benign.

## 2021-09-01 ENCOUNTER — Other Ambulatory Visit: Payer: Self-pay | Admitting: Family Medicine

## 2021-09-01 DIAGNOSIS — E2839 Other primary ovarian failure: Secondary | ICD-10-CM

## 2021-10-14 DIAGNOSIS — Z720 Tobacco use: Secondary | ICD-10-CM | POA: Diagnosis not present

## 2021-10-14 DIAGNOSIS — L739 Follicular disorder, unspecified: Secondary | ICD-10-CM | POA: Diagnosis not present

## 2021-10-14 DIAGNOSIS — I10 Essential (primary) hypertension: Secondary | ICD-10-CM | POA: Diagnosis not present

## 2021-10-14 DIAGNOSIS — E871 Hypo-osmolality and hyponatremia: Secondary | ICD-10-CM | POA: Diagnosis not present

## 2021-11-13 DIAGNOSIS — H43822 Vitreomacular adhesion, left eye: Secondary | ICD-10-CM | POA: Diagnosis not present

## 2021-11-13 DIAGNOSIS — H3540 Unspecified peripheral retinal degeneration: Secondary | ICD-10-CM | POA: Diagnosis not present

## 2021-11-13 DIAGNOSIS — H43811 Vitreous degeneration, right eye: Secondary | ICD-10-CM | POA: Diagnosis not present

## 2021-11-13 DIAGNOSIS — H35371 Puckering of macula, right eye: Secondary | ICD-10-CM | POA: Diagnosis not present

## 2021-11-13 DIAGNOSIS — H43391 Other vitreous opacities, right eye: Secondary | ICD-10-CM | POA: Diagnosis not present

## 2021-12-29 DIAGNOSIS — H43813 Vitreous degeneration, bilateral: Secondary | ICD-10-CM | POA: Diagnosis not present

## 2021-12-29 DIAGNOSIS — H4321 Crystalline deposits in vitreous body, right eye: Secondary | ICD-10-CM | POA: Diagnosis not present

## 2021-12-29 DIAGNOSIS — H33193 Other retinoschisis and retinal cysts, bilateral: Secondary | ICD-10-CM | POA: Diagnosis not present

## 2021-12-29 DIAGNOSIS — H35423 Microcystoid degeneration of retina, bilateral: Secondary | ICD-10-CM | POA: Diagnosis not present

## 2021-12-29 DIAGNOSIS — H43822 Vitreomacular adhesion, left eye: Secondary | ICD-10-CM | POA: Diagnosis not present

## 2022-01-13 DIAGNOSIS — E785 Hyperlipidemia, unspecified: Secondary | ICD-10-CM | POA: Diagnosis not present

## 2022-01-13 DIAGNOSIS — Z720 Tobacco use: Secondary | ICD-10-CM | POA: Diagnosis not present

## 2022-01-13 DIAGNOSIS — I10 Essential (primary) hypertension: Secondary | ICD-10-CM | POA: Diagnosis not present

## 2022-02-23 ENCOUNTER — Ambulatory Visit
Admission: RE | Admit: 2022-02-23 | Discharge: 2022-02-23 | Disposition: A | Discharge status: Home / Self Care | Payer: Medicare HMO | Source: Ambulatory Visit | Attending: Family Medicine | Admitting: Family Medicine

## 2022-02-23 DIAGNOSIS — M85852 Other specified disorders of bone density and structure, left thigh: Secondary | ICD-10-CM | POA: Diagnosis not present

## 2022-02-23 DIAGNOSIS — Z780 Asymptomatic menopausal state: Secondary | ICD-10-CM | POA: Diagnosis not present

## 2022-02-23 DIAGNOSIS — M810 Age-related osteoporosis without current pathological fracture: Secondary | ICD-10-CM | POA: Diagnosis not present

## 2022-02-23 DIAGNOSIS — E2839 Other primary ovarian failure: Secondary | ICD-10-CM

## 2022-02-27 DIAGNOSIS — M810 Age-related osteoporosis without current pathological fracture: Secondary | ICD-10-CM | POA: Diagnosis not present

## 2022-02-27 DIAGNOSIS — H5203 Hypermetropia, bilateral: Secondary | ICD-10-CM | POA: Diagnosis not present

## 2022-02-27 DIAGNOSIS — F331 Major depressive disorder, recurrent, moderate: Secondary | ICD-10-CM | POA: Diagnosis not present

## 2022-02-27 DIAGNOSIS — Z0100 Encounter for examination of eyes and vision without abnormal findings: Secondary | ICD-10-CM | POA: Diagnosis not present

## 2022-02-27 DIAGNOSIS — Z720 Tobacco use: Secondary | ICD-10-CM | POA: Diagnosis not present

## 2022-07-07 DIAGNOSIS — Z9181 History of falling: Secondary | ICD-10-CM | POA: Diagnosis not present

## 2022-07-07 DIAGNOSIS — R931 Abnormal findings on diagnostic imaging of heart and coronary circulation: Secondary | ICD-10-CM | POA: Diagnosis not present

## 2022-07-07 DIAGNOSIS — Z1211 Encounter for screening for malignant neoplasm of colon: Secondary | ICD-10-CM | POA: Diagnosis not present

## 2022-07-07 DIAGNOSIS — E785 Hyperlipidemia, unspecified: Secondary | ICD-10-CM | POA: Diagnosis not present

## 2022-07-07 DIAGNOSIS — M810 Age-related osteoporosis without current pathological fracture: Secondary | ICD-10-CM | POA: Diagnosis not present

## 2022-07-07 DIAGNOSIS — I10 Essential (primary) hypertension: Secondary | ICD-10-CM | POA: Diagnosis not present

## 2022-07-07 DIAGNOSIS — Z720 Tobacco use: Secondary | ICD-10-CM | POA: Diagnosis not present

## 2022-07-07 DIAGNOSIS — Z7289 Other problems related to lifestyle: Secondary | ICD-10-CM | POA: Diagnosis not present

## 2022-07-07 DIAGNOSIS — Z0000 Encounter for general adult medical examination without abnormal findings: Secondary | ICD-10-CM | POA: Diagnosis not present

## 2022-07-21 DIAGNOSIS — Z1211 Encounter for screening for malignant neoplasm of colon: Secondary | ICD-10-CM | POA: Diagnosis not present

## 2022-10-12 DIAGNOSIS — E785 Hyperlipidemia, unspecified: Secondary | ICD-10-CM | POA: Diagnosis not present

## 2023-01-05 DIAGNOSIS — F17210 Nicotine dependence, cigarettes, uncomplicated: Secondary | ICD-10-CM | POA: Diagnosis not present

## 2023-01-05 DIAGNOSIS — M810 Age-related osteoporosis without current pathological fracture: Secondary | ICD-10-CM | POA: Diagnosis not present

## 2023-01-05 DIAGNOSIS — R5383 Other fatigue: Secondary | ICD-10-CM | POA: Diagnosis not present

## 2023-01-05 DIAGNOSIS — Z7289 Other problems related to lifestyle: Secondary | ICD-10-CM | POA: Diagnosis not present

## 2023-01-05 DIAGNOSIS — Z23 Encounter for immunization: Secondary | ICD-10-CM | POA: Diagnosis not present

## 2023-01-05 DIAGNOSIS — E785 Hyperlipidemia, unspecified: Secondary | ICD-10-CM | POA: Diagnosis not present

## 2023-01-05 DIAGNOSIS — Z720 Tobacco use: Secondary | ICD-10-CM | POA: Diagnosis not present

## 2023-01-05 DIAGNOSIS — F17200 Nicotine dependence, unspecified, uncomplicated: Secondary | ICD-10-CM | POA: Diagnosis not present

## 2023-01-05 DIAGNOSIS — I10 Essential (primary) hypertension: Secondary | ICD-10-CM | POA: Diagnosis not present

## 2023-01-11 DIAGNOSIS — I10 Essential (primary) hypertension: Secondary | ICD-10-CM | POA: Diagnosis not present

## 2023-01-11 DIAGNOSIS — B028 Zoster with other complications: Secondary | ICD-10-CM | POA: Diagnosis not present

## 2023-02-19 DIAGNOSIS — C44519 Basal cell carcinoma of skin of other part of trunk: Secondary | ICD-10-CM | POA: Diagnosis not present

## 2023-02-19 DIAGNOSIS — L304 Erythema intertrigo: Secondary | ICD-10-CM | POA: Diagnosis not present

## 2023-02-19 DIAGNOSIS — D225 Melanocytic nevi of trunk: Secondary | ICD-10-CM | POA: Diagnosis not present

## 2023-02-19 DIAGNOSIS — Z85828 Personal history of other malignant neoplasm of skin: Secondary | ICD-10-CM | POA: Diagnosis not present

## 2023-02-19 DIAGNOSIS — Z7189 Other specified counseling: Secondary | ICD-10-CM | POA: Diagnosis not present

## 2023-02-19 DIAGNOSIS — L821 Other seborrheic keratosis: Secondary | ICD-10-CM | POA: Diagnosis not present

## 2023-02-19 DIAGNOSIS — C44619 Basal cell carcinoma of skin of left upper limb, including shoulder: Secondary | ICD-10-CM | POA: Diagnosis not present

## 2023-02-19 DIAGNOSIS — L608 Other nail disorders: Secondary | ICD-10-CM | POA: Diagnosis not present

## 2023-02-19 DIAGNOSIS — Z08 Encounter for follow-up examination after completed treatment for malignant neoplasm: Secondary | ICD-10-CM | POA: Diagnosis not present

## 2023-02-19 DIAGNOSIS — D485 Neoplasm of uncertain behavior of skin: Secondary | ICD-10-CM | POA: Diagnosis not present

## 2023-02-19 DIAGNOSIS — L814 Other melanin hyperpigmentation: Secondary | ICD-10-CM | POA: Diagnosis not present

## 2023-03-02 DIAGNOSIS — L905 Scar conditions and fibrosis of skin: Secondary | ICD-10-CM | POA: Diagnosis not present

## 2023-03-02 DIAGNOSIS — C44619 Basal cell carcinoma of skin of left upper limb, including shoulder: Secondary | ICD-10-CM | POA: Diagnosis not present

## 2023-03-02 DIAGNOSIS — C44519 Basal cell carcinoma of skin of other part of trunk: Secondary | ICD-10-CM | POA: Diagnosis not present

## 2023-05-03 DIAGNOSIS — J029 Acute pharyngitis, unspecified: Secondary | ICD-10-CM | POA: Diagnosis not present

## 2023-05-03 DIAGNOSIS — R251 Tremor, unspecified: Secondary | ICD-10-CM | POA: Diagnosis not present

## 2023-05-03 DIAGNOSIS — I10 Essential (primary) hypertension: Secondary | ICD-10-CM | POA: Diagnosis not present

## 2023-05-03 DIAGNOSIS — R053 Chronic cough: Secondary | ICD-10-CM | POA: Diagnosis not present

## 2023-05-03 DIAGNOSIS — F17210 Nicotine dependence, cigarettes, uncomplicated: Secondary | ICD-10-CM | POA: Diagnosis not present

## 2023-05-03 DIAGNOSIS — Z03818 Encounter for observation for suspected exposure to other biological agents ruled out: Secondary | ICD-10-CM | POA: Diagnosis not present

## 2023-05-31 DIAGNOSIS — Z6831 Body mass index (BMI) 31.0-31.9, adult: Secondary | ICD-10-CM | POA: Diagnosis not present

## 2023-05-31 DIAGNOSIS — Z008 Encounter for other general examination: Secondary | ICD-10-CM | POA: Diagnosis not present

## 2023-05-31 DIAGNOSIS — F17210 Nicotine dependence, cigarettes, uncomplicated: Secondary | ICD-10-CM | POA: Diagnosis not present

## 2023-05-31 DIAGNOSIS — F411 Generalized anxiety disorder: Secondary | ICD-10-CM | POA: Diagnosis not present

## 2023-05-31 DIAGNOSIS — E785 Hyperlipidemia, unspecified: Secondary | ICD-10-CM | POA: Diagnosis not present

## 2023-05-31 DIAGNOSIS — E669 Obesity, unspecified: Secondary | ICD-10-CM | POA: Diagnosis not present

## 2023-05-31 DIAGNOSIS — M810 Age-related osteoporosis without current pathological fracture: Secondary | ICD-10-CM | POA: Diagnosis not present

## 2023-05-31 DIAGNOSIS — F324 Major depressive disorder, single episode, in partial remission: Secondary | ICD-10-CM | POA: Diagnosis not present

## 2023-05-31 DIAGNOSIS — I10 Essential (primary) hypertension: Secondary | ICD-10-CM | POA: Diagnosis not present

## 2023-06-09 DIAGNOSIS — L821 Other seborrheic keratosis: Secondary | ICD-10-CM | POA: Diagnosis not present

## 2023-06-09 DIAGNOSIS — C44319 Basal cell carcinoma of skin of other parts of face: Secondary | ICD-10-CM | POA: Diagnosis not present

## 2023-06-09 DIAGNOSIS — C44529 Squamous cell carcinoma of skin of other part of trunk: Secondary | ICD-10-CM | POA: Diagnosis not present

## 2023-06-09 DIAGNOSIS — D485 Neoplasm of uncertain behavior of skin: Secondary | ICD-10-CM | POA: Diagnosis not present

## 2023-06-14 DIAGNOSIS — C44529 Squamous cell carcinoma of skin of other part of trunk: Secondary | ICD-10-CM | POA: Diagnosis not present

## 2023-06-14 DIAGNOSIS — C44319 Basal cell carcinoma of skin of other parts of face: Secondary | ICD-10-CM | POA: Diagnosis not present

## 2023-07-07 DIAGNOSIS — M25561 Pain in right knee: Secondary | ICD-10-CM | POA: Diagnosis not present

## 2023-07-07 DIAGNOSIS — Z79899 Other long term (current) drug therapy: Secondary | ICD-10-CM | POA: Diagnosis not present

## 2023-07-07 DIAGNOSIS — Z0000 Encounter for general adult medical examination without abnormal findings: Secondary | ICD-10-CM | POA: Diagnosis not present

## 2023-07-07 DIAGNOSIS — F17210 Nicotine dependence, cigarettes, uncomplicated: Secondary | ICD-10-CM | POA: Diagnosis not present

## 2023-07-07 DIAGNOSIS — R5383 Other fatigue: Secondary | ICD-10-CM | POA: Diagnosis not present

## 2023-07-07 DIAGNOSIS — I10 Essential (primary) hypertension: Secondary | ICD-10-CM | POA: Diagnosis not present

## 2023-07-07 DIAGNOSIS — M810 Age-related osteoporosis without current pathological fracture: Secondary | ICD-10-CM | POA: Diagnosis not present

## 2023-07-07 DIAGNOSIS — Z7289 Other problems related to lifestyle: Secondary | ICD-10-CM | POA: Diagnosis not present

## 2023-07-07 DIAGNOSIS — R931 Abnormal findings on diagnostic imaging of heart and coronary circulation: Secondary | ICD-10-CM | POA: Diagnosis not present

## 2023-07-07 DIAGNOSIS — Z1211 Encounter for screening for malignant neoplasm of colon: Secondary | ICD-10-CM | POA: Diagnosis not present

## 2023-07-07 DIAGNOSIS — E785 Hyperlipidemia, unspecified: Secondary | ICD-10-CM | POA: Diagnosis not present

## 2023-07-09 DIAGNOSIS — Z1211 Encounter for screening for malignant neoplasm of colon: Secondary | ICD-10-CM | POA: Diagnosis not present

## 2023-07-09 DIAGNOSIS — I10 Essential (primary) hypertension: Secondary | ICD-10-CM | POA: Diagnosis not present

## 2023-07-09 DIAGNOSIS — R5383 Other fatigue: Secondary | ICD-10-CM | POA: Diagnosis not present

## 2023-07-09 DIAGNOSIS — E785 Hyperlipidemia, unspecified: Secondary | ICD-10-CM | POA: Diagnosis not present

## 2023-07-09 DIAGNOSIS — Z79899 Other long term (current) drug therapy: Secondary | ICD-10-CM | POA: Diagnosis not present

## 2023-07-23 DIAGNOSIS — M1711 Unilateral primary osteoarthritis, right knee: Secondary | ICD-10-CM | POA: Diagnosis not present

## 2023-08-05 DIAGNOSIS — M25561 Pain in right knee: Secondary | ICD-10-CM | POA: Diagnosis not present

## 2023-08-05 DIAGNOSIS — M25562 Pain in left knee: Secondary | ICD-10-CM | POA: Diagnosis not present

## 2023-08-09 DIAGNOSIS — M1712 Unilateral primary osteoarthritis, left knee: Secondary | ICD-10-CM | POA: Diagnosis not present

## 2023-08-09 DIAGNOSIS — M84451A Pathological fracture, right femur, initial encounter for fracture: Secondary | ICD-10-CM | POA: Diagnosis not present

## 2023-08-23 DIAGNOSIS — S83241A Other tear of medial meniscus, current injury, right knee, initial encounter: Secondary | ICD-10-CM | POA: Diagnosis not present

## 2023-08-31 DIAGNOSIS — S83241D Other tear of medial meniscus, current injury, right knee, subsequent encounter: Secondary | ICD-10-CM | POA: Diagnosis not present

## 2023-09-13 DIAGNOSIS — S83241A Other tear of medial meniscus, current injury, right knee, initial encounter: Secondary | ICD-10-CM | POA: Diagnosis not present

## 2023-09-21 DIAGNOSIS — S83241D Other tear of medial meniscus, current injury, right knee, subsequent encounter: Secondary | ICD-10-CM | POA: Diagnosis not present

## 2023-10-11 DIAGNOSIS — E785 Hyperlipidemia, unspecified: Secondary | ICD-10-CM | POA: Diagnosis not present

## 2023-10-19 DIAGNOSIS — M170 Bilateral primary osteoarthritis of knee: Secondary | ICD-10-CM | POA: Diagnosis not present

## 2023-10-26 DIAGNOSIS — M170 Bilateral primary osteoarthritis of knee: Secondary | ICD-10-CM | POA: Diagnosis not present

## 2024-03-08 NOTE — Progress Notes (Signed)
 Anna Joseph                                          MRN: [id number]   03/08/2024   The [facility] Quality Team Specialist reviewed this patient medical record for the purposes of chart review for care gap closure. The following were reviewed: chart review for care gap closure-controlling blood pressure.    [facility] Quality Team
